# Patient Record
Sex: Male | Born: 2005 | Race: White | Hispanic: No | Marital: Single | Smoking: Never smoker
Health system: Southern US, Community
[De-identification: ages and names within clinical notes are randomized; demographics above are authoritative.]

## PROBLEM LIST (undated history)

## (undated) DIAGNOSIS — Q539 Undescended testicle, unspecified: Secondary | ICD-10-CM

## (undated) DIAGNOSIS — F909 Attention-deficit hyperactivity disorder, unspecified type: Secondary | ICD-10-CM

## (undated) HISTORY — DX: Attention-deficit hyperactivity disorder, unspecified type: F90.9

---
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
## 2023-03-22 ENCOUNTER — Encounter (HOSPITAL_COMMUNITY): Payer: Self-pay | Admitting: Emergency Medicine

## 2023-03-22 ENCOUNTER — Emergency Department (HOSPITAL_COMMUNITY)
Admission: EM | Admit: 2023-03-22 | Discharge: 2023-03-22 | Disposition: A | Discharge status: Home / Self Care | Payer: Medicaid Other | Attending: Emergency Medicine | Admitting: Emergency Medicine

## 2023-03-22 ENCOUNTER — Other Ambulatory Visit: Payer: Self-pay

## 2023-03-22 DIAGNOSIS — T7421XA Adult sexual abuse, confirmed, initial encounter: Secondary | ICD-10-CM | POA: Diagnosis present

## 2023-03-22 HISTORY — DX: Undescended testicle, unspecified: Q53.9
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
## 2023-03-22 NOTE — ED Notes (Signed)
Pt d/c home with visitor per MD order. Discharge summary reviewed, pt verbalizes understanding. Ambulatory off unit. No s/s of acute distress noted at discharge.  
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
## 2023-03-24 ENCOUNTER — Emergency Department (HOSPITAL_COMMUNITY)
Admission: EM | Admit: 2023-03-24 | Discharge: 2023-03-25 | Disposition: A | Discharge status: Home / Self Care | Payer: Medicaid Other | Attending: Emergency Medicine | Admitting: Emergency Medicine

## 2023-03-24 ENCOUNTER — Other Ambulatory Visit: Payer: Self-pay

## 2023-03-24 ENCOUNTER — Ambulatory Visit (HOSPITAL_COMMUNITY)
Admission: EM | Admit: 2023-03-24 | Discharge: 2023-03-24 | Disposition: A | Discharge status: Home / Self Care | Payer: No Typology Code available for payment source | Source: Ambulatory Visit | Attending: Emergency Medicine | Admitting: Emergency Medicine

## 2023-03-24 DIAGNOSIS — T7421XD Adult sexual abuse, confirmed, subsequent encounter: Secondary | ICD-10-CM | POA: Diagnosis present

## 2023-03-24 DIAGNOSIS — Z0441 Encounter for examination and observation following alleged adult rape: Secondary | ICD-10-CM | POA: Insufficient documentation
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
## 2023-03-25 NOTE — ED Notes (Signed)
Pt with SANE nurse off the floor at this time.
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
